# Patient Record
Sex: Female | Born: 1995 | Race: White | Hispanic: No | Marital: Single | Smoking: Never smoker
Health system: Southern US, Community
[De-identification: ages and names within clinical notes are randomized; demographics above are authoritative.]

## PROBLEM LIST (undated history)

## (undated) DIAGNOSIS — J45909 Unspecified asthma, uncomplicated: Secondary | ICD-10-CM

## (undated) DIAGNOSIS — G43909 Migraine, unspecified, not intractable, without status migrainosus: Secondary | ICD-10-CM

## (undated) DIAGNOSIS — Z789 Other specified health status: Secondary | ICD-10-CM

## (undated) HISTORY — PX: GANGLION CYST EXCISION: SHX1691

## (undated) HISTORY — DX: Other specified health status: Z78.9

## (undated) HISTORY — PX: WISDOM TOOTH EXTRACTION: SHX21

---
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
## 2017-01-07 ENCOUNTER — Ambulatory Visit (HOSPITAL_COMMUNITY)
Admission: EM | Admit: 2017-01-07 | Discharge: 2017-01-07 | Disposition: A | Discharge status: Home / Self Care | Payer: 59 | Attending: Emergency Medicine | Admitting: Emergency Medicine

## 2017-01-07 ENCOUNTER — Encounter (HOSPITAL_COMMUNITY): Payer: Self-pay | Admitting: Emergency Medicine

## 2017-01-07 DIAGNOSIS — K2900 Acute gastritis without bleeding: Secondary | ICD-10-CM | POA: Diagnosis not present

## 2017-01-07 DIAGNOSIS — J302 Other seasonal allergic rhinitis: Secondary | ICD-10-CM

## 2017-01-07 LAB — POCT URINALYSIS DIP (DEVICE)
GLUCOSE, UA: NEGATIVE mg/dL
KETONES UR: NEGATIVE mg/dL
Nitrite: NEGATIVE
Protein, ur: 30 mg/dL — AB
SPECIFIC GRAVITY, URINE: 1.025 (ref 1.005–1.030)
Urobilinogen, UA: 1 mg/dL (ref 0.0–1.0)
pH: 7 (ref 5.0–8.0)

## 2017-01-07 LAB — POCT H PYLORI SCREEN: H. PYLORI SCREEN, POC: NEGATIVE

## 2017-01-07 LAB — POCT PREGNANCY, URINE: Preg Test, Ur: NEGATIVE

## 2017-01-07 MED ORDER — RANITIDINE HCL 150 MG PO TABS: 150.0000 mg | ORAL_TABLET | Freq: Two times a day (BID) | ORAL | 0 refills | Status: DC

## 2017-01-07 MED ORDER — FLUTICASONE PROPIONATE 50 MCG/ACT NA SUSP: 2.0000 | Freq: Every day | NASAL | 2 refills | Status: DC

## 2017-01-07 MED ORDER — OMEPRAZOLE 20 MG PO CPDR: 20.0000 mg | DELAYED_RELEASE_CAPSULE | Freq: Every day | ORAL | 0 refills | Status: DC

## 2017-01-07 NOTE — ED Provider Notes (Signed)
[facility]    CSN: [id number] Arrival date & time: 01/07/17  1325     History   Chief Complaint Chief Complaint  Patient presents with  . URI    HPI Joan Estrada is a 21 y.o. female.   HPI  She is a 21 year old woman here with her aunt for evaluation of abdominal pain. She reports a two-week history of intermittent abdominal pain. It is getting worse, especially over the last 2 days. She reports sharp pains that occur in the epigastric area. They last for about 30 seconds and are very intense. Sometimes they occur every 5 minutes, sometimes less frequently. The are triggered by eating. She has had an episode of vomiting yesterday as well as some loose stool. She denies any blood in the vomit or stool. Denies frequent use of NSAIDs, caffeine, alcohol, chocolate. She is a nonsmoker. No urinary symptoms. No vaginal discharge.  She also reports a long history of nasal congestion and postnasal drainage. She states this causes her to cough. She is not currently taking any medication for this. No fevers. She reports some wheezing yesterday, but this responded well to her albuterol inhaler.  Past Medical History:  Diagnosis Date  . Asthma   . Migraine     There are no active problems to display for this patient.   Past Surgical History:  Procedure Laterality Date  . GANGLION CYST EXCISION    . WISDOM TOOTH EXTRACTION      OB History    No data available       Home Medications    Prior to Admission medications   Medication Sig Start Date End Date Taking? Authorizing Provider  albuterol (PROVENTIL HFA;VENTOLIN HFA) 108 (90 BASE) MCG/ACT inhaler Inhale 4 puffs into the lungs every 4 (four) hours as needed for wheezing or shortness of breath. 04/14/15  Yes Marcellina Millin, MD  fluticasone (FLONASE) 50 MCG/ACT nasal spray Place 2 sprays into both nostrils daily. 01/07/17   Charm Rings, MD  omeprazole (PRILOSEC) 20 MG capsule Take 1 capsule (20 mg total) by mouth  daily. 01/07/17   Charm Rings, MD  ranitidine (ZANTAC) 150 MG tablet Take 1 tablet (150 mg total) by mouth 2 (two) times daily. 01/07/17   Charm Rings, MD    Family History No family history on file.  Social History Social History  Substance Use Topics  . Smoking status: Never Smoker  . Smokeless tobacco: Never Used  . Alcohol use Yes     Allergies   Patient has no known allergies.   Review of Systems Review of Systems As in history of present illness  Physical Exam Triage Vital Signs ED Triage Vitals [01/07/17 1435]  Enc Vitals Group     BP 109/74     Pulse Rate 103     Resp 20     Temp 98.2 F (36.8 C)     Temp Source Oral     SpO2 99 %     Weight      Height      Head Circumference      Peak Flow      Pain Score      Pain Loc      Pain Edu?      Excl. in GC?    No data found.   Updated Vital Signs BP 109/74 (BP Location: Left Arm)   Pulse 103   Temp 98.2 F (36.8 C) (Oral)   Resp 20  LMP 12/09/2016   SpO2 99%   Visual Acuity Right Eye Distance:   Left Eye Distance:   Bilateral Distance:    Right Eye Near:   Left Eye Near:    Bilateral Near:     Physical Exam  Constitutional: She is oriented to person, place, and time. She appears well-developed and well-nourished. No distress.  HENT:  Mouth/Throat: No oropharyngeal exudate.  TMs normal bilaterally. Nasal mucosa slightly inflamed. Minor erythema to oropharynx.  Neck: Neck supple.  Cardiovascular: Normal rate, regular rhythm and normal heart sounds.   No murmur heard. Pulmonary/Chest: Effort normal and breath sounds normal. No respiratory distress. She has no wheezes. She has no rales.  Abdominal: Soft. Bowel sounds are normal. She exhibits no distension. There is tenderness (in suprapubic and epigastric). There is no rebound and no guarding.  No CVA tenderness. Negative Murphy's.  Lymphadenopathy:    She has no cervical adenopathy.  Neurological: She is alert and oriented to person,  place, and time.     UC Treatments / Results  Labs (all labs ordered are listed, but only abnormal results are displayed) Labs Reviewed  POCT URINALYSIS DIP (DEVICE) - Abnormal; Notable for the following:       Result Value   Bilirubin Urine SMALL (*)    Hgb urine dipstick TRACE (*)    Protein, ur 30 (*)    Leukocytes, UA TRACE (*)    All other components within normal limits  POCT PREGNANCY, URINE  POCT H PYLORI SCREEN    EKG  EKG Interpretation None       Radiology No results found.  Procedures Procedures (including critical care time)  Medications Ordered in UC Medications - No data to display   Initial Impression / Assessment and Plan / UC Course  I have reviewed the triage vital signs and the nursing notes.  Pertinent labs & imaging results that were available during my care of the patient were reviewed by me and considered in my medical decision making (see chart for details).     Upper respiratory symptoms likely due to chronic allergies. Recommended loratadine and Flonase.  Abdominal pain most consistent with gastritis. Treat with omeprazole and ranitidine for 2 weeks.  UA is abnormal, but patient is about to start her period and has had some spotting. No symptoms of UTI.  Follow-up with PCP if not improving over the next 2 weeks.  Final Clinical Impressions(s) / UC Diagnoses   Final diagnoses:  Chronic seasonal allergic rhinitis, unspecified trigger  Acute gastritis without hemorrhage, unspecified gastritis type    New Prescriptions New Prescriptions   FLUTICASONE (FLONASE) 50 MCG/ACT NASAL SPRAY    Place 2 sprays into both nostrils daily.   OMEPRAZOLE (PRILOSEC) 20 MG CAPSULE    Take 1 capsule (20 mg total) by mouth daily.   RANITIDINE (ZANTAC) 150 MG TABLET    Take 1 tablet (150 mg total) by mouth 2 (two) times daily.     Charm RingsErin J Vivica Dobosz, MD 01/07/17 1539

## 2017-01-07 NOTE — ED Triage Notes (Signed)
Pt c/o cold sx onset 2 weeks and intermittent epigastric pain onset: 1 week ++   Sx include: nasal congestion, prod cough  Sx include: n/v/d... Had 2 loose stools yest and x1 vomiting yest  Denies: fevers, urinary sx  Taking: OTC cold meds  A&O x4... NAD

## 2017-01-07 NOTE — Discharge Instructions (Signed)
The congestion and drainage is likely coming from allergies. Take Claritin or loratadine daily. Use Flonase daily at bedtime. You will likely need to use these on a regular basis. Stop using the nasal spray you have already.  Your abdominal pain is likely coming from gastritis. This is irritation to the lining of your stomach. Take omeprazole daily for 2 weeks. Take ranitidine twice a day for 2 weeks. You should see improvement within a few days. If this is not improving, or comes back, please follow-up with your primary care doctor for additional evaluation.

## 2017-04-13 ENCOUNTER — Encounter (HOSPITAL_COMMUNITY): Payer: Self-pay | Admitting: Emergency Medicine

## 2017-04-13 ENCOUNTER — Ambulatory Visit (HOSPITAL_COMMUNITY)
Admission: EM | Admit: 2017-04-13 | Discharge: 2017-04-13 | Disposition: A | Discharge status: Home / Self Care | Payer: 59 | Attending: Internal Medicine | Admitting: Internal Medicine

## 2017-04-13 DIAGNOSIS — R0982 Postnasal drip: Secondary | ICD-10-CM | POA: Diagnosis not present

## 2017-04-13 DIAGNOSIS — J4520 Mild intermittent asthma, uncomplicated: Secondary | ICD-10-CM | POA: Diagnosis not present

## 2017-04-13 DIAGNOSIS — J029 Acute pharyngitis, unspecified: Secondary | ICD-10-CM

## 2017-04-13 DIAGNOSIS — J301 Allergic rhinitis due to pollen: Secondary | ICD-10-CM

## 2017-04-13 MED ORDER — PREDNISONE 20 MG PO TABS: ORAL_TABLET | ORAL | 0 refills | Status: DC

## 2017-04-13 NOTE — ED Triage Notes (Signed)
Pt states she was having sinus drainage yesterday with a mild sore throat.  Today she woke up with her throat hurting more and she noticed white on her throat.

## 2017-04-13 NOTE — Discharge Instructions (Signed)
Continue using the Allegra and Flonase nasal spray. Drink plenty of fluids to stay well-hydrated and to keep the drainage off the back of your throat. For stronger antihistaminic effect such as at nighttime he may take Chlor-Trimeton AKA chlorpheniramine 2 or 4 mg every 4 hours as needed or just at nighttime. Take the prednisone as directed and with food. Do not forget to take your inhaler with you on your trip as well as the other medicine she takes for allergies. HAPPY BIRTHDAY TOMORROW, ENJOY. 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

## 2017-04-13 NOTE — ED Provider Notes (Signed)
CSN: [id number]     Arrival date & time 04/13/17  1014 History   First MD Initiated Contact with Patient 04/13/17 1124     Chief Complaint  Patient presents with  . Sore Throat   (Consider location/radiation/quality/duration/timing/severity/associated sxs/prior Treatment) 21 year old female has had some PND for the past few days and follow it by soreness of the throat. She is going camping day after tomorrow and also make sure she does not have strep. She does have a history of seasonal allergies with PND, runny nose and occasional ear discomfort. She denies any recent fevers or chills. She does take a daily 24-hour Allegra as well as Flonase daily. LMP was approximately 3 weeks ago. Patient has a history of asthma but has not had symptoms recently.      Past Medical History:  Diagnosis Date  . Asthma   . Migraine    Past Surgical History:  Procedure Laterality Date  . GANGLION CYST EXCISION    . WISDOM TOOTH EXTRACTION     History reviewed. No pertinent family history. Social History  Substance Use Topics  . Smoking status: Never Smoker  . Smokeless tobacco: Never Used  . Alcohol use Yes   OB History    No data available     Review of Systems  Constitutional: Negative.  Negative for activity change, appetite change, chills, fatigue and fever.  HENT: Positive for congestion, postnasal drip and rhinorrhea. Negative for facial swelling.   Eyes: Negative.   Respiratory: Positive for cough. Negative for shortness of breath and wheezing.   Cardiovascular: Negative.   Gastrointestinal: Negative.   Musculoskeletal: Negative for neck pain and neck stiffness.  Skin: Negative for pallor and rash.  Neurological: Negative.   All other systems reviewed and are negative.   Allergies  Patient has no known allergies.  Home Medications   Prior to Admission medications   Medication Sig Start Date End Date Taking? Authorizing Provider  albuterol (PROVENTIL HFA;VENTOLIN HFA) 108  (90 BASE) MCG/ACT inhaler Inhale 4 puffs into the lungs every 4 (four) hours as needed for wheezing or shortness of breath. 04/14/15  Yes Marcellina Millin, MD  fluticasone (FLONASE) 50 MCG/ACT nasal spray Place 2 sprays into both nostrils daily. 01/07/17  Yes Charm Rings, MD  ranitidine (ZANTAC) 150 MG tablet Take 1 tablet (150 mg total) by mouth 2 (two) times daily. 01/07/17  Yes Charm Rings, MD  omeprazole (PRILOSEC) 20 MG capsule Take 1 capsule (20 mg total) by mouth daily. 01/07/17   Charm Rings, MD  predniSONE (DELTASONE) 20 MG tablet Take 2 tabs po on first day, 2 tabs second day, 2 tabs third day, 1 tab fourth day, 1 tab 5th day. Take with food. 04/13/17   Hayden Rasmussen, NP   Meds Ordered and Administered this Visit  Medications - No data to display  BP 105/75 (BP Location: Left Arm)   Pulse 73   Temp 98.3 F (36.8 C) (Oral)   LMP 03/18/2017 (Approximate)   SpO2 100%  No data found.   Physical Exam  Constitutional: She is oriented to person, place, and time. She appears well-developed and well-nourished. No distress.  HENT:  Head: Normocephalic and atraumatic.  Right Ear: External ear normal.  Left Ear: External ear normal.  Mouth/Throat: No oropharyngeal exudate.  Oropharynx with minimal injection, no erythema, positive for clear PND. No exudates. Airway widely patent. Voice is normal.  Lateral TMs normal.  Eyes: EOM are normal.  Neck: Normal range of motion. Neck  supple.  Cardiovascular: Normal rate, regular rhythm, normal heart sounds and intact distal pulses.   Pulmonary/Chest: Effort normal and breath sounds normal. No respiratory distress. She has no wheezes. She has no rales. She exhibits no tenderness.  Musculoskeletal: Normal range of motion. She exhibits no edema.  Lymphadenopathy:    She has no cervical adenopathy.  Neurological: She is alert and oriented to person, place, and time.  Skin: Skin is warm. She is diaphoretic.  Psychiatric: She has a normal mood and affect.   Nursing note and vitals reviewed.   Urgent Care Course     Procedures (including critical care time)  Labs Review Labs Reviewed - No data to display  Imaging Review No results found.   Visual Acuity Review  Right Eye Distance:   Left Eye Distance:   Bilateral Distance:    Right Eye Near:   Left Eye Near:    Bilateral Near:         MDM   1. Sore throat   2. PND (post-nasal drip)   3. Seasonal allergic rhinitis due to pollen   4. Mild intermittent asthma without complication    Continue using the Allegra and Flonase nasal spray. Drink plenty of fluids to stay well-hydrated and to keep the drainage off the back of your throat. For stronger antihistaminic effect such as at nighttime he may take Chlor-Trimeton AKA chlorpheniramine 2 or 4 mg every 4 hours as needed or just at nighttime. Take the prednisone as directed and with food. Do not forget to take your inhaler with you on your trip as well as the other medicine she takes for allergies. HAPPY BIRTHDAY TOMORROW, ENJOY. 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 Meds ordered this encounter  Medications  . predniSONE (DELTASONE) 20 MG tablet    Sig: Take 2 tabs po on first day, 2 tabs second day, 2 tabs third day, 1 tab fourth day, 1 tab 5th day. Take with food.    Dispense:  8 tablet    Refill:  0    Order Specific Question:   Supervising Provider    Answer:   Eustace Moore [[id number]]       Hayden Rasmussen, NP 04/13/17 1153

## 2019-07-02 DIAGNOSIS — Z1159 Encounter for screening for other viral diseases: Secondary | ICD-10-CM | POA: Insufficient documentation

## 2021-04-10 ENCOUNTER — Other Ambulatory Visit (HOSPITAL_COMMUNITY)
Admission: RE | Admit: 2021-04-10 | Discharge: 2021-04-10 | Disposition: A | Discharge status: Home / Self Care | Payer: 59 | Source: Ambulatory Visit | Attending: Physician Assistant | Admitting: Physician Assistant

## 2021-04-10 ENCOUNTER — Other Ambulatory Visit: Payer: Self-pay | Admitting: Physician Assistant

## 2021-04-10 DIAGNOSIS — Z124 Encounter for screening for malignant neoplasm of cervix: Secondary | ICD-10-CM | POA: Diagnosis not present

## 2021-04-16 LAB — CYTOLOGY - PAP
Chlamydia: NEGATIVE
Comment: NEGATIVE
Comment: NEGATIVE
Comment: NORMAL
Diagnosis: UNDETERMINED — AB
High risk HPV: NEGATIVE
Neisseria Gonorrhea: NEGATIVE

## 2022-04-21 DIAGNOSIS — F900 Attention-deficit hyperactivity disorder, predominantly inattentive type: Secondary | ICD-10-CM | POA: Insufficient documentation

## 2023-02-24 NOTE — Progress Notes (Signed)
 Subjective:  Patient ID: Joan Estrada is a 27 y.o. female.  Chief Complaint  Patient presents with  . Mouth or throat complaint     HPI  Mouth or throat complaint Primary Symptom: Sore Throat/Mouth Problem   Duration of current symptoms:  2 days Associated symptoms: no abdominal pain, no myalgias, no chills, no nasal congestion, no cough, no appetite change, no drooling, no ear pain, no facial swelling, no fatigue, no fever, no headaches, no mouth sores, no neck stiffness, no rash, no rhinorrhea, no diaphoresis and no swollen glands   Red flag features: no muffled or 'hot potato' voice, no hoarseness, no drooling, no stridor, no respiratory distress and no 'sniffing' or 'tripod' positions   Treatments tried: gargling   Response to Treatment: No change   Special considerations: none apply          Review of Systems  Constitutional: Negative for appetite change, chills, diaphoresis, fatigue and fever.  HENT: Negative for congestion, drooling, ear pain, facial swelling, mouth sores and rhinorrhea.   Respiratory: Negative for cough.   Gastrointestinal: Negative for abdominal pain.  Musculoskeletal: Negative for myalgias and neck stiffness.  Skin: Negative for rash.  Neurological: Negative for headaches.    Social History   Tobacco Use  Smoking Status Never  Smokeless Tobacco Never   History reviewed. No pertinent past medical history. History reviewed. No pertinent surgical history. History reviewed. No pertinent family history. Objective:      Physical Exam Vitals reviewed.  Constitutional:      General: She is not in acute distress.    Appearance: Normal appearance. She is not ill-appearing.  HENT:     Head: Normocephalic and atraumatic.     Right Ear: Hearing, tympanic membrane, ear canal and external ear normal.     Left Ear: Hearing, tympanic membrane, ear canal and external ear normal.     Nose: Nose normal. No mucosal edema, congestion or rhinorrhea.      Right Sinus: No maxillary sinus tenderness or frontal sinus tenderness.     Left Sinus: No maxillary sinus tenderness or frontal sinus tenderness.     Mouth/Throat:     Mouth: Mucous membranes are moist.     Pharynx: Oropharynx is clear. Posterior oropharyngeal erythema present. No pharyngeal swelling or oropharyngeal exudate.     Tonsils: 1+ on the right. 1+ on the left.  Eyes:     Extraocular Movements: Extraocular movements intact.     Conjunctiva/sclera: Conjunctivae normal.     Pupils: Pupils are equal, round, and reactive to light.  Cardiovascular:     Rate and Rhythm: Normal rate.     Pulses: Normal pulses.     Heart sounds: Normal heart sounds. No murmur heard.    No friction rub. No gallop.  Pulmonary:     Effort: Pulmonary effort is normal. No respiratory distress.     Breath sounds: Normal breath sounds. No stridor. No wheezing, rhonchi or rales.  Chest:     Chest wall: No tenderness.  Abdominal:     Palpations: Abdomen is soft. Abdomen is not rigid.     Tenderness: There is no abdominal tenderness.  Musculoskeletal:        General: Normal range of motion.  Lymphadenopathy:     Cervical: No cervical adenopathy.  Skin:    General: Skin is warm and dry.  Neurological:     General: No focal deficit present.     Mental Status: She is alert and oriented to person, place, and  time.  Psychiatric:        Mood and Affect: Mood normal.        Behavior: Behavior normal.       Assessment/Plan:  HPI provided by Self  Based on today's visit:history, physical exam and all relevant testing completed in clinic today patient's visit diagnosis is/includes  1. Acute pharyngitis, unspecified etiology   2. Sore throat    Patient does not have a history of chronic conditions.  Treatment plan includes:  Orders Placed: Orders Placed This Encounter  Procedures  . POCT Strep Molecular   Medications ordered this visit   Signed Prescriptions Disp Refills  . acetaminophen  325  mg cap 30 capsule 0    Sig: Take 1-2 capsules by mouth 4 (four) times a day for 7 days Max 12 capsules in 24 hours.    Current medication list and any new medications prescribed or recommended today were reviewed with the patient and specific instructions were provided Yes  Provider Recommendations   Gargle with salt water.  Increase fluid intake.   Tylenol  or Ibuprofen as per label instructions for pain and/or fever.  Call 911 or go to the nearest emergency department if experiencing: difficulty breathing, inability to swallow fluids, drooling saliva from the mouth, or decreased urination.   Follow up with primary care provider immediately if symptoms worsen or mouth pain interferes with the ability to eat or drink.   Follow up with primary care provider if:   - Symptoms not improved in 3-4 days   Follow up care instructions were provided and reviewed?with the  Patient. All questions were answered. Patient verbalized understanding of plan of care today.

## 2023-08-02 ENCOUNTER — Ambulatory Visit: Payer: No Typology Code available for payment source | Admitting: Family Medicine

## 2023-08-17 ENCOUNTER — Ambulatory Visit: Payer: No Typology Code available for payment source | Admitting: Family Medicine

## 2023-08-17 ENCOUNTER — Encounter: Payer: Self-pay | Admitting: Family Medicine

## 2023-08-17 VITALS — BP 108/68 | HR 62 | Temp 98.1°F | Ht 66.0 in | Wt 154.0 lb

## 2023-08-17 DIAGNOSIS — Z0000 Encounter for general adult medical examination without abnormal findings: Secondary | ICD-10-CM | POA: Diagnosis not present

## 2023-08-17 DIAGNOSIS — L989 Disorder of the skin and subcutaneous tissue, unspecified: Secondary | ICD-10-CM

## 2023-08-17 NOTE — Progress Notes (Signed)
Chief Complaint  Patient presents with   New Patient (Initial Visit)     Well Woman Joan Estrada is here for a complete physical.   Her last physical was >1 year ago.  Current diet: in general, a "healthy" diet. Current exercise: yoga, walking. Fatigue out of ordinary? No Seatbelt? Yes Advanced directive? No  Health Maintenance Pap/HPV- Yes Tetanus- Yes HIV screening- Yes Hep C screening- Yes  Past Medical History:  Diagnosis Date   No known health problems      Past Surgical History:  Procedure Laterality Date   GANGLION CYST EXCISION Left 2010   L wrist   WISDOM TOOTH EXTRACTION  2016    Medications  Current Outpatient Medications on File Prior to Visit  Medication Sig Dispense Refill   PARAGARD INTRAUTERINE COPPER IU by Intrauterine route.      Allergies No Known Allergies  Review of Systems: Constitutional:  no unexpected weight changes Eye:  no recent significant change in vision Ear/Nose/Mouth/Throat:  Ears:  no tinnitus or vertigo and no recent change in hearing Nose/Mouth/Throat:  no complaints of nasal congestion, no sore throat Cardiovascular: no chest pain Respiratory:  no cough and no shortness of breath Gastrointestinal:  no abdominal pain, no change in bowel habits GU:  Female: negative for dysuria or pelvic pain Musculoskeletal/Extremities:  no pain of the joints Integumentary (Skin/Breast):  Lesion on R cheek Neurologic:  no headaches Endocrine:  denies fatigue Hematologic/Lymphatic:  No areas of easy bleeding  Exam BP 108/68 (BP Location: Left Arm, Patient Position: Sitting, Cuff Size: Normal)   Pulse 62   Temp 98.1 F (36.7 C) (Oral)   Ht 5\' 6"  (1.676 m)   Wt 154 lb (69.9 kg)   SpO2 98%   BMI 24.86 kg/m  General:  well developed, well nourished, in no apparent distress Skin:  See below; otherwise, no significant moles, warts, or growths Head:  no masses, lesions, or tenderness Eyes:  pupils equal and round, sclera anicteric  without injection Ears:  canals without lesions, TMs shiny without retraction, no obvious effusion, no erythema Nose:  nares patent, mucosa normal, and no drainage  Throat/Pharynx:  lips and gingiva without lesion; tongue and uvula midline; non-inflamed pharynx; no exudates or postnasal drainage Neck: neck supple without adenopathy, thyromegaly, or masses Lungs:  clear to auscultation, breath sounds equal bilaterally, no respiratory distress Cardio:  regular rate and rhythm, no bruits, no LE edema Abdomen:  abdomen soft, nontender; bowel sounds normal; no masses or organomegaly Genital: Defer to GYN Musculoskeletal:  symmetrical muscle groups noted without atrophy or deformity Extremities:  no clubbing, cyanosis, or edema, no deformities, no skin discoloration Neuro:  gait normal; deep tendon reflexes normal and symmetric Psych: well oriented with normal range of affect and appropriate judgment/insight   R side of face, scaly and slightly raised lesion  Assessment and Plan  Well adult exam - Plan: CBC, Comprehensive metabolic panel, Lipid panel  Skin lesion - Plan: Ambulatory referral to Dermatology   Well 27 y.o. female. Counseled on diet and exercise. Advanced directive form provided today.  Pt reports changing contour of the lesion on her R cheek and requesting to see derm. Referral placed.  Other orders as above. Follow up in 1 yr. The patient voiced understanding and agreement to the plan.  Jilda Roche [place], DO 08/17/23 4:53 PM

## 2023-08-17 NOTE — Patient Instructions (Addendum)
Call Center for [facility] at [facility] at [phone] for an appointment.  They are located at [address], [place], [place], [address] (right across the hall from our office).  Keep the diet clean and stay active.  Please consider adding some weight resistance exercise to your routine. Consider yoga as well.   If you do not hear anything about your referral in the next 1-2 weeks, call our office and ask for an update.  I recommend getting the flu shot in mid October. This suggestion would change if the CDC comes out with a different recommendation.   Give [place] 2-3 business days to get the results of your labs back.   Let us know if you need anything.

## 2023-08-18 LAB — CBC
HCT: 40.6 % (ref 36.0–46.0)
Hemoglobin: 13.6 g/dL (ref 12.0–15.0)
MCHC: 33.5 g/dL (ref 30.0–36.0)
MCV: 94.2 fl (ref 78.0–100.0)
Platelets: 294 10*3/uL (ref 150.0–400.0)
RBC: 4.31 Mil/uL (ref 3.87–5.11)
RDW: 13 % (ref 11.5–15.5)
WBC: 6.3 10*3/uL (ref 4.0–10.5)

## 2023-08-18 LAB — LIPID PANEL
Cholesterol: 204 mg/dL — ABNORMAL HIGH (ref 0–200)
HDL: 84.1 mg/dL (ref >39.00)
LDL Cholesterol: 88 mg/dL (ref 0–99)
NonHDL: 119.52
Total CHOL/HDL Ratio: 2
Triglycerides: 158 mg/dL — ABNORMAL HIGH (ref 0.0–149.0)
VLDL: 31.6 mg/dL (ref 0.0–40.0)

## 2023-08-18 LAB — COMPREHENSIVE METABOLIC PANEL
ALT: 11 U/L (ref 0–35)
AST: 16 U/L (ref 0–37)
Albumin: 4.5 g/dL (ref 3.5–5.2)
Alkaline Phosphatase: 44 U/L (ref 39–117)
BUN: 15 mg/dL (ref 6–23)
CO2: 27 meq/L (ref 19–32)
Calcium: 9.5 mg/dL (ref 8.4–10.5)
Chloride: 101 meq/L (ref 96–112)
Creatinine, Ser: 0.9 mg/dL (ref 0.40–1.20)
GFR: 88.33 mL/min (ref >60.00)
Glucose, Bld: 88 mg/dL (ref 70–99)
Potassium: 4.6 meq/L (ref 3.5–5.1)
Sodium: 138 meq/L (ref 135–145)
Total Bilirubin: 0.4 mg/dL (ref 0.2–1.2)
Total Protein: 7.1 g/dL (ref 6.0–8.3)

## 2023-10-06 ENCOUNTER — Encounter: Payer: Self-pay | Admitting: Family Medicine

## 2023-11-09 ENCOUNTER — Encounter: Payer: Self-pay | Admitting: Family Medicine

## 2023-11-09 ENCOUNTER — Telehealth (INDEPENDENT_AMBULATORY_CARE_PROVIDER_SITE_OTHER): Payer: Managed Care, Other (non HMO) | Admitting: Family Medicine

## 2023-11-09 DIAGNOSIS — F338 Other recurrent depressive disorders: Secondary | ICD-10-CM | POA: Insufficient documentation

## 2023-11-09 DIAGNOSIS — F411 Generalized anxiety disorder: Secondary | ICD-10-CM | POA: Diagnosis not present

## 2023-11-09 MED ORDER — BUPROPION HCL ER (XL) 150 MG PO TB24: 150.0000 mg | ORAL_TABLET | Freq: Every day | ORAL | 1 refills | Status: DC

## 2023-11-09 NOTE — Progress Notes (Signed)
Chief Complaint  Patient presents with   Medication Management    Discuss medication    Subjective Joan Estrada presents for f/u anxiety/depression. We are interacting via web portal for an electronic face-to-face visit. I verified patient's ID using 2 identifiers. Patient agreed to proceed with visit via this method. Patient is at home, I am at office. Patient and I are present for visit.   Pt is currently being treated with nothing.  Reports difficulty concentrating, depressed mood, racing thoughts.  She has been on Wellbutrin in the past and did well. No thoughts of harming self or others. No self-medication with alcohol, prescription drugs or illicit drugs. Pt is not following with a counselor/psychologist.  Past Medical History:  Diagnosis Date   No known health problems    Allergies as of 11/09/2023   No Known Allergies      Medication List        Accurate as of November 09, 2023  2:05 PM. If you have any questions, ask your nurse or doctor.          PARAGARD INTRAUTERINE COPPER IU by Intrauterine route.        Exam No conversational dyspnea Age appropriate judgment and insight Nml affect and mood  Assessment and Plan  GAD (generalized anxiety disorder)  Seasonal affective disorder (HCC)  1/2.  Chronic, uncontrolled.  Start Wellbutrin XL 150 mg daily.  She has been on this before and has done historically well.  In light of this, I will see her in 5 months.  If she is not doing well in the next month a month and a half, she will follow-up and we will proceed accordingly. The patient voiced understanding and agreement to the plan.  [name], DO 11/09/23 2:05 PM

## 2024-02-16 ENCOUNTER — Encounter: Payer: Self-pay | Admitting: Family Medicine

## 2024-03-17 ENCOUNTER — Encounter: Payer: Self-pay | Admitting: Family Medicine

## 2024-03-17 DIAGNOSIS — F338 Other recurrent depressive disorders: Secondary | ICD-10-CM

## 2024-03-17 DIAGNOSIS — F411 Generalized anxiety disorder: Secondary | ICD-10-CM

## 2024-03-17 MED ORDER — BUPROPION HCL ER (XL) 150 MG PO TB24: 150.0000 mg | ORAL_TABLET | Freq: Every day | ORAL | 1 refills | Status: DC

## 2024-05-25 ENCOUNTER — Ambulatory Visit (INDEPENDENT_AMBULATORY_CARE_PROVIDER_SITE_OTHER)

## 2024-05-25 ENCOUNTER — Ambulatory Visit (INDEPENDENT_AMBULATORY_CARE_PROVIDER_SITE_OTHER): Admitting: Sports Medicine

## 2024-05-25 ENCOUNTER — Ambulatory Visit (INDEPENDENT_AMBULATORY_CARE_PROVIDER_SITE_OTHER): Admitting: Family

## 2024-05-25 ENCOUNTER — Other Ambulatory Visit: Payer: Self-pay

## 2024-05-25 VITALS — BP 120/80 | Ht 66.0 in | Wt 136.0 lb

## 2024-05-25 VITALS — BP 122/74 | HR 70 | Ht 66.0 in | Wt 136.2 lb

## 2024-05-25 DIAGNOSIS — M25531 Pain in right wrist: Secondary | ICD-10-CM

## 2024-05-25 DIAGNOSIS — M79641 Pain in right hand: Secondary | ICD-10-CM

## 2024-05-25 DIAGNOSIS — M778 Other enthesopathies, not elsewhere classified: Secondary | ICD-10-CM

## 2024-05-25 DIAGNOSIS — M79631 Pain in right forearm: Secondary | ICD-10-CM | POA: Diagnosis not present

## 2024-05-25 MED ORDER — MELOXICAM 15 MG PO TABS: 15.0000 mg | ORAL_TABLET | Freq: Every day | ORAL | 0 refills | Status: DC

## 2024-05-25 NOTE — Patient Instructions (Signed)
-   Start meloxicam 15 mg daily x3 weeks. May use remaining NSAID as needed once daily for pain control.  Do not to use additional over-the-counter NSAIDs (ibuprofen, naproxen, Advil, Aleve, etc.) while taking prescription NSAIDs.  May use Tylenol  [phone] mg 2 to 3 times a day for breakthrough pain. Recommend using a wrist brace Wrist HEP  4-6 week follow up

## 2024-05-25 NOTE — Progress Notes (Signed)
 [facility] [address] [place] [address] Phone: [phone]   Assessment and Plan:     1. Right wrist pain 2. Right forearm pain 3. Extensor carpi ulnaris tendinitis -Acute, initial visit - Most consistent with musculotendinous strain of right ECU likely occurring during kickboxing exercise - X-ray obtained in clinic.  My interpretation: No acute fracture or dislocation - Recommend using wrist brace without thumb spica for rest and protection over the next 3 to 4 weeks - Recommend starting gentle wrist HEP to prevent stiffness - Start meloxicam 15 mg daily x3weeks.  May use remaining NSAID as needed once daily for pain control.  Do not to use additional over-the-counter NSAIDs (ibuprofen, naproxen, Advil, Aleve, etc.) while taking prescription NSAIDs.  May use Tylenol  [phone] mg 2 to 3 times a day for breakthrough pain.    Sports Medicine: Musculoskeletal Ultrasound. Exam: Limited US  of right wrist and forearm Diagnosis: Right wrist/forearm pain  US  Findings: No cortical abnormalities.  ECU tendon intact.  Hypoechoic stranding through musculotendinous junction of ECU that coincided with area of TTP   Images permanently stored.  US  Impression:  ECU musculotendinous strain  15 additional minutes spent for educating Therapeutic Home Exercise Program.  This included exercises focusing on stretching, strengthening, with focus on eccentric aspects.   Long term goals include an improvement in range of motion, strength, endurance as well as avoiding reinjury. Patient's frequency would include in 1-2 times a day, 3-5 times a week for a duration of 6-12 weeks. Proper technique shown and discussed handout in great detail with ATC.  All questions were discussed and answered.    Pertinent previous records reviewed include urgent care note 05/25/2024  Follow Up: 4 weeks for reevaluation.  Could consider physical therapy versus prednisone   course   Subjective:   I, Joan Estrada, am serving as a Neurosurgeon for Doctor Ulysees Gander  Chief Complaint: right wrist pain   HPI:   05/25/24 Patient is a 28 year old female with right wrist pain. Patient states was punching in kickboxing last night. Able to make a fist. Decreased ROM . Pain Is in forearm. No meds for the pain. Mild swelling. Is leaving for a trip Sunday. No numbness a little tingling. Pain radiates up the forearm. She I warm to the touch patient states   Relevant Historical Information: None pertinent  Additional pertinent review of systems negative.   Current Outpatient Medications:    buPROPion  (WELLBUTRIN  XL) 150 MG 24 hr tablet, Take 1 tablet (150 mg total) by mouth daily., Disp: 90 tablet, Rfl: 1   meloxicam (MOBIC) 15 MG tablet, Take 1 tablet (15 mg total) by mouth daily., Disp: 30 tablet, Rfl: 0   PARAGARD INTRAUTERINE COPPER IU, by Intrauterine route., Disp: , Rfl:    Objective:     Vitals:   05/25/24 1436  BP: 120/80  Weight: 136 lb (61.7 kg)  Height: 5\' 6"  (1.676 m)      Body mass index is 21.95 kg/m.    Physical Exam:    General: Appears well, nad, nontoxic and pleasant Neuro:sensation intact, strength is 5/5 in upper extremities, muscle tone wnl Skin:no susupicious lesions or rashes  Right hand/Wrist:   No deformity or swelling appreciated. TTP lateral distal forearm ROM  Ext 90, flexion 70, radial/ulnar deviation 10/30 nttp over the snuff box, dorsal carpals, volar carpals, radial styloid, ulnar styloid, 1st mcp, tfcc Negative Tinel's, Phalen's, Prayer Tests Negative finklestein Neg tfcc bounce test  No pain with resisted ext, flex Pain with resisted and passive ulnar deviation   Electronically signed by:  Marshall Skeeter [facility] 3:25 PM 05/25/24

## 2024-05-25 NOTE — Progress Notes (Signed)
  Joan Estrada is a 28 y.o. female with the following history as recorded in Epic[employer]:  Patient Active Problem List   Diagnosis Date Noted   GAD (generalized anxiety disorder) 11/09/2023   Seasonal affective disorder (HCC) 11/09/2023    Current Outpatient Medications  Medication Sig Dispense Refill   buPROPion  (WELLBUTRIN  XL) 150 MG 24 hr tablet Take 1 tablet (150 mg total) by mouth daily. 90 tablet 1   PARAGARD INTRAUTERINE COPPER IU by Intrauterine route.     No current facility-administered medications for this visit.    Allergies: Patient has no known allergies.  Past Medical History:  Diagnosis Date   No known health problems     Past Surgical History:  Procedure Laterality Date   GANGLION CYST EXCISION Left 2010   L wrist   WISDOM TOOTH EXTRACTION  2016    Family History  Problem Relation Age of Onset   Heart disease Maternal Grandfather    Lung cancer Paternal Grandmother        didn't smoke   Cervical cancer Neg Hx    Breast cancer Neg Hx    Ovarian cancer Neg Hx    Diabetes Neg Hx     Social History   Tobacco Use   Smoking status: Never   Smokeless tobacco: Never  Substance Use Topics   Alcohol use: Yes    Comment: 3 glasses of wine    Subjective:   Injured her right wrist/ forearm while doing boxing class last night; worried about increased pain with movement; notes that pain shoots up the forearm when she moves the wrist;     Objective:  Vitals:   05/25/24 1046  BP: 122/74  Pulse: 70  SpO2: 99%  Weight: 136 lb 3.2 oz (61.8 kg)  Height: 5\' 6"  (1.676 m)    General: Well developed, well nourished, in no acute distress  Skin : Warm and dry.  Head: Normocephalic and atraumatic  Lungs: Respirations unlabored;  Musculoskeletal: No deformities; mild swelling noted over lateral right wrist; limited range of motion due to pain; Extremities: No edema, cyanosis, clubbing  Vessels: Symmetric bilaterally  Neurologic: Alert and oriented; speech  intact; face symmetrical; moves all extremities well; CNII-XII intact without focal deficit   Assessment:  1. Acute pain of right wrist     Plan:  Concern for possible boxer's fracture vs wrist sprain; patient is leaving for 3 week trip to [place] on Sunday and planning an active vacation; will have her see sports medicine today to ensure proper diagnosis and correct splint applied; in the interim, apply ice and rest the wrist;  Did speak to sports medicine provider's office and asked that we not do imaging here so they can do their own there.   No follow-ups on file.  No orders of the defined types were placed in this encounter.   Requested Prescriptions    No prescriptions requested or ordered in this encounter

## 2024-05-26 ENCOUNTER — Ambulatory Visit: Payer: Self-pay | Admitting: Sports Medicine

## 2024-06-21 ENCOUNTER — Ambulatory Visit (INDEPENDENT_AMBULATORY_CARE_PROVIDER_SITE_OTHER): Payer: Self-pay | Admitting: Podiatry

## 2024-06-21 ENCOUNTER — Encounter: Payer: Self-pay | Admitting: Family Medicine

## 2024-06-21 ENCOUNTER — Telehealth (INDEPENDENT_AMBULATORY_CARE_PROVIDER_SITE_OTHER): Admitting: Family Medicine

## 2024-06-21 DIAGNOSIS — F900 Attention-deficit hyperactivity disorder, predominantly inattentive type: Secondary | ICD-10-CM

## 2024-06-21 DIAGNOSIS — J309 Allergic rhinitis, unspecified: Secondary | ICD-10-CM | POA: Insufficient documentation

## 2024-06-21 DIAGNOSIS — L03032 Cellulitis of left toe: Secondary | ICD-10-CM | POA: Diagnosis not present

## 2024-06-21 DIAGNOSIS — J4520 Mild intermittent asthma, uncomplicated: Secondary | ICD-10-CM | POA: Insufficient documentation

## 2024-06-21 DIAGNOSIS — F321 Major depressive disorder, single episode, moderate: Secondary | ICD-10-CM | POA: Insufficient documentation

## 2024-06-21 DIAGNOSIS — L600 Ingrowing nail: Secondary | ICD-10-CM

## 2024-06-21 DIAGNOSIS — N938 Other specified abnormal uterine and vaginal bleeding: Secondary | ICD-10-CM | POA: Insufficient documentation

## 2024-06-21 DIAGNOSIS — F325 Major depressive disorder, single episode, in full remission: Secondary | ICD-10-CM | POA: Insufficient documentation

## 2024-06-21 DIAGNOSIS — G43109 Migraine with aura, not intractable, without status migrainosus: Secondary | ICD-10-CM | POA: Insufficient documentation

## 2024-06-21 DIAGNOSIS — N939 Abnormal uterine and vaginal bleeding, unspecified: Secondary | ICD-10-CM | POA: Insufficient documentation

## 2024-06-21 MED ORDER — FLUCONAZOLE 150 MG PO TABS: 150.0000 mg | ORAL_TABLET | Freq: Once | ORAL | 0 refills | Status: AC

## 2024-06-21 MED ORDER — AMPHETAMINE-DEXTROAMPHETAMINE 10 MG PO TABS: 10.0000 mg | ORAL_TABLET | Freq: Two times a day (BID) | ORAL | 0 refills | Status: DC

## 2024-06-21 MED ORDER — AMOXICILLIN 500 MG PO CAPS: 500.0000 mg | ORAL_CAPSULE | Freq: Two times a day (BID) | ORAL | 0 refills | Status: AC

## 2024-06-21 NOTE — Progress Notes (Signed)
 Chief Complaint  Patient presents with   Ingrown Toenail    Treatment done in [place]. Left 1st hallux was clipped some, some swelling and drainage. Still painful. Right 1st has a brace on it to lift the nail. Minimal pain, some swelling, no redness. Was given a topical cream called fucidin 2%. She is hoping that the nails do not have to be removed if at all possible. Not diabetic and no anticoag.    HPI: 28 y.o. female presents today with an ingrown toenail to the lateral border of the bilateral hallux toenails.  The left hallux lateral nail border does have redness and some fleshy growth present.  She wants to try to avoid having any type of definitive procedure performed due to cosmetic reasons.  She was treated in [place] for this when she was on a hiking trip.  They clued a steel rod horizontally onto her toe to try to encourage the nail to lift and flatten out to improve the shape of the nail.  Past Medical History:  Diagnosis Date   No known health problems    Past Surgical History:  Procedure Laterality Date   GANGLION CYST EXCISION Left 2010   L wrist   WISDOM TOOTH EXTRACTION  2016   No Known Allergies   Physical Exam: Palpable pedal pulses bilateral.  The hallux toenails have incurvated borders.  There is pain on palpation along the distal lateral borders.  The left hallux lateral nail margin does have a granuloma present with localized erythema.  No active drainage is noted.  There is a small metal rod still attached to the right hallux nail which part has become unglued and is sticking up.  Assessment/Plan of Care: 1. Ingrown toenail   2. Paronychia of toe of left foot     Meds ordered this encounter  Medications   amoxicillin  (AMOXIL ) 500 MG capsule    Sig: Take 1 capsule (500 mg total) by mouth 2 (two) times daily for 7 days.    Dispense:  14 capsule    Refill:  0   fluconazole  (DIFLUCAN ) 150 MG tablet    Sig: Take 1 tablet (150 mg total) by mouth once for 1  dose. This is for yeast infections.    Dispense:  1 tablet    Refill:  0   Strongly recommended a PNA procedure to the left hallux lateral nail border since there is a granuloma present and localized infection.  She declined.  Informed her that it is not standard of care in the [place]  to glue these steel rods onto the toenails.  She was informed there is something similar on [place] that she could purchase and try on her own.  She could also try sticking cotton beneath the offending nail margin to see if this will cause some lifting.  However the standard of care is a PNA procedure.  She is worried about the cosmetic appearance of the toenail so the procedure was discussed in detail.  She will think about it and if she does not have improvement she may call to have this performed.  In the meantime we will treat the infection with amoxicillin  500 mg 1 tablet p.o. twice daily x 7 days.  She sometimes gets a yeast infection from antibiotics so we will also send in a Diflucan  tablet 150 mg to take in case of yeast infection  Follow-up as needed  Eshani Maestre DSABRA [place], DPM, FACFAS [facility]  [address] [place], [place] [address]                Office [phone]  Fax [phone]

## 2024-06-21 NOTE — Progress Notes (Signed)
 Chief Complaint  Patient presents with   Medication Refill    Discuss Medication    Joan Estrada is 28 y.o. female here for ADHD follow up. We are interacting via web portal for an electronic face-to-face visit. I verified patient's ID using 2 identifiers. Patient agreed to proceed with visit via this method. Patient is at home, I am at office. Patient and I are present for visit.   Patient is currently on nothing. Symptoms include inattention, staying on task. She was on Adderall 10 mg bid in the past.  She was dx'd when she was a teenager. Pediatric psych dx'd her.  Started a new position where she has more responsibility.   Past Medical History:  Diagnosis Date   No known health problems    No conversational dyspnea Age appropriate judgment and insight Nml affect and mood  Attention deficit hyperactivity disorder (ADHD), predominantly inattentive type - Plan: amphetamine-dextroamphetamine (ADDERALL) 10 MG tablet  Chronic, not currently controlled.  Restart Adderall 10 mg twice daily.  Requested she try to find her formal diagnosis information and get it sent to us .  Follow-up in 1 month in the office. Pt voiced understanding and agreement to the plan.  Mabel Mt [place], DO 06/21/24 2:43 PM

## 2024-06-26 NOTE — Progress Notes (Unsigned)
    Ben Jackson D.[facility] [address] [place] [address] Phone: [phone]   Assessment and Plan:     There are no diagnoses linked to this encounter.  ***   Pertinent previous records reviewed include ***    Follow Up: ***     Subjective:   I, Joan Estrada, am serving as a Neurosurgeon for Doctor Morene Mace   Chief Complaint: right wrist pain    HPI:    05/25/24 Patient is a 28 year old female with right wrist pain. Patient states was punching in kickboxing last night. Able to make a fist. Decreased ROM . Pain Is in forearm. No meds for the pain. Mild swelling. Is leaving for a trip Sunday. No numbness a little tingling. Pain radiates up the forearm. She I warm to the touch patient states   06/27/2024 Patient states   Relevant Historical Information: None pertinent  Additional pertinent review of systems negative.   Current Outpatient Medications:    amoxicillin  (AMOXIL ) 500 MG capsule, Take 1 capsule (500 mg total) by mouth 2 (two) times daily for 7 days., Disp: 14 capsule, Rfl: 0   amphetamine -dextroamphetamine  (ADDERALL) 10 MG tablet, Take 1 tablet (10 mg total) by mouth 2 (two) times daily., Disp: 60 tablet, Rfl: 0   buPROPion  (WELLBUTRIN  XL) 150 MG 24 hr tablet, Take 1 tablet (150 mg total) by mouth daily., Disp: 90 tablet, Rfl: 1   meloxicam  (MOBIC ) 15 MG tablet, Take 1 tablet (15 mg total) by mouth daily., Disp: 30 tablet, Rfl: 0   PARAGARD INTRAUTERINE COPPER IU, by Intrauterine route., Disp: , Rfl:    UNABLE TO FIND, Apply 1 Application topically daily. Med Name: Fucidin 2% cream, Disp: , Rfl:    Objective:     There were no vitals filed for this visit.    There is no height or weight on file to calculate BMI.    Physical Exam:    ***   Electronically signed by:  Odis Mace D.[facility] 7:36 AM 06/26/24

## 2024-06-27 ENCOUNTER — Ambulatory Visit (INDEPENDENT_AMBULATORY_CARE_PROVIDER_SITE_OTHER): Admitting: Sports Medicine

## 2024-06-27 VITALS — BP 110/70 | HR 96 | Ht 66.0 in

## 2024-06-27 DIAGNOSIS — M778 Other enthesopathies, not elsewhere classified: Secondary | ICD-10-CM | POA: Diagnosis not present

## 2024-06-27 DIAGNOSIS — M79631 Pain in right forearm: Secondary | ICD-10-CM | POA: Diagnosis not present

## 2024-06-27 DIAGNOSIS — M25531 Pain in right wrist: Secondary | ICD-10-CM | POA: Diagnosis not present

## 2024-06-27 NOTE — Patient Instructions (Addendum)
 Discontinue meloxicam .  -Recommend gradual return to physical activity.  Start activity at 50% (speed, duration, reps, sets, intensity) and allow 24 hours to assess for worsening pain.  If 50% is well-tolerated, may increase next activity to 75%.  If 75% is well-tolerated, may increase next physical activity to 100%.  If any of these levels cause pain, recommend dropping down to previous level for an additional 2-3 attempts before advancing.  If no significant improvement in 2 to 4 weeks call and ask for a prednisone  course or PT. Follow up as needed.

## 2024-07-02 ENCOUNTER — Encounter: Payer: Self-pay | Admitting: Podiatry

## 2024-07-17 ENCOUNTER — Ambulatory Visit: Admitting: Podiatry

## 2024-07-18 ENCOUNTER — Encounter: Payer: Self-pay | Admitting: Podiatry

## 2024-07-18 ENCOUNTER — Ambulatory Visit: Admitting: Podiatry

## 2024-07-18 ENCOUNTER — Ambulatory Visit (INDEPENDENT_AMBULATORY_CARE_PROVIDER_SITE_OTHER): Admitting: Podiatry

## 2024-07-18 DIAGNOSIS — L600 Ingrowing nail: Secondary | ICD-10-CM | POA: Diagnosis not present

## 2024-07-18 MED ORDER — CEPHALEXIN 500 MG PO CAPS: 1000.0000 mg | ORAL_CAPSULE | Freq: Two times a day (BID) | ORAL | 0 refills | Status: AC

## 2024-07-18 NOTE — Patient Instructions (Signed)

## 2024-07-18 NOTE — Progress Notes (Signed)
 Patient complains of painful ingrown lateral border(s) toe 1 left. Patient denies fevers, chills, nausea, vomiting.  Objective:  Vitals: Reviewed  General: Well developed, nourished, in no acute distress, alert and oriented x3   Vascular: DP pulse 2/4 bilateral. PT pulse 2/4 bilateral. Moderate edema   Dermatology: Erythema, edema, incurvated nail border lateral hallux left with clear drainage . Tenderness present with palpation. Normal skin tone and texture feet with normal hair growth.  Neurological: Grossly intact. Normal reflexes.   Musculoskeletal: Tenderness with palpation of the distal hallux left. No tenderness or painful ROM at IPJ.  Diagnosis: Ingrown nail lateral border hallux left  Plan: -discussed etiology and treatment of ingrown nails. Discussed surgical vs conservative treatment. -Consent signed for appropriate matrixectomy affected nail(s). -Rx: Keflex  500 mg 2 p.o. twice daily for 7 days  Procedure(s):   - Matrixectomy(s) lateral border hallux left: Toe anesthetized with 3cc 2:1 mixture 2% Lidocaine with epinephrine: Sodium Bicarbonate. Surgical site prepped. Digital tourniquet applied.  Avulsion of nail border. performed. Matrixecomy performed with three 30 second applications of phenol to nail matrix. Site irrigated with alcohol.  Tourniquet released with good vascularity noticed in digit.  Applied triple antibiotic to nailbed and applied gauze and Coban dressing. - Written and oral postoperative instructions given.  -Return for post-op 2 weeks.  JINNY Prentice Binder, DPM

## 2024-07-19 ENCOUNTER — Other Ambulatory Visit: Payer: Self-pay | Admitting: Podiatry

## 2024-07-19 ENCOUNTER — Encounter: Payer: Self-pay | Admitting: Podiatry

## 2024-07-19 ENCOUNTER — Telehealth: Payer: Self-pay | Admitting: Podiatry

## 2024-07-19 DIAGNOSIS — M79673 Pain in unspecified foot: Secondary | ICD-10-CM

## 2024-07-19 DIAGNOSIS — G8918 Other acute postprocedural pain: Secondary | ICD-10-CM

## 2024-07-19 MED ORDER — NEOMYCIN-POLYMYXIN-HC 3.5-10000-1 OT SOLN: OTIC | 1 refills | Status: DC

## 2024-07-19 MED ORDER — TRAMADOL HCL 50 MG PO TABS: 50.0000 mg | ORAL_TABLET | Freq: Three times a day (TID) | ORAL | 0 refills | Status: AC | PRN

## 2024-07-19 NOTE — Telephone Encounter (Signed)
 Patient called back and stated she is in severe pain. Please contact patient at, [phone]

## 2024-07-19 NOTE — Telephone Encounter (Signed)
 Patient rates pain as 10 . What can she do to relieve the pain?

## 2024-08-01 ENCOUNTER — Ambulatory Visit: Admitting: Podiatry

## 2024-08-18 ENCOUNTER — Ambulatory Visit: Admitting: Obstetrics and Gynecology

## 2024-08-18 ENCOUNTER — Encounter: Payer: Self-pay | Admitting: Obstetrics and Gynecology

## 2024-08-18 ENCOUNTER — Other Ambulatory Visit (HOSPITAL_COMMUNITY)
Admission: RE | Admit: 2024-08-18 | Discharge: 2024-08-18 | Disposition: A | Discharge status: Home / Self Care | Source: Ambulatory Visit | Attending: Obstetrics and Gynecology | Admitting: Obstetrics and Gynecology

## 2024-08-18 VITALS — BP 110/81 | HR 64 | Ht 66.0 in | Wt 141.1 lb

## 2024-08-18 DIAGNOSIS — Z124 Encounter for screening for malignant neoplasm of cervix: Secondary | ICD-10-CM

## 2024-08-18 DIAGNOSIS — Z01419 Encounter for gynecological examination (general) (routine) without abnormal findings: Secondary | ICD-10-CM

## 2024-08-18 NOTE — Progress Notes (Signed)
 ANNUAL EXAM Patient name: Joan Estrada MRN [id number]  Date of birth: August 09, 1996 Chief Complaint:   Establish Care (Establish care) and Gynecologic Exam  History of Present Illness:   Joan Estrada is a 28 y.o. G0P0000 being seen today for a routine annual exam.  Current complaints: annual  Menstrual concerns? Yes  32 day cycles, not late more than a few days, heavier with the IUD (not heavier prior to insertion). In HS would skip menses for a few months and was on the pill  Breast or nipple changes? No  Contraception use? Yes paragard since 2018 Sexually active? Yes some pain with intercourse more recently , female partner  Has questions regarding paragard and fertility   Patient's last menstrual period was 08/15/2024 (exact date).   The pregnancy intention screening data noted above was reviewed. Potential methods of contraception were discussed. The patient elected to proceed with No data recorded.  Black/wiry IUD strings, nontender pelvic  Last pap     Component Value Date/Time   DIAGPAP (A) 04/10/2021 0000    - Atypical squamous cells of undetermined significance (ASC-US )   HPVHIGH Negative 04/10/2021 0000   ADEQPAP  04/10/2021 0000    Satisfactory for evaluation; transformation zone component PRESENT.    Last mammogram: n/a.  Last colonoscopy: n/a.      06/21/2024    1:09 PM 11/09/2023    1:56 PM 08/17/2023    3:42 PM  Depression screen PHQ 2/9  Decreased Interest 0 1 0  Down, Depressed, Hopeless 0 0 0  PHQ - 2 Score 0 1 0  Altered sleeping 0 0 0  Tired, decreased energy 0 1 0  Change in appetite 0 0 0  Feeling bad or failure about yourself  0 1 0  Trouble concentrating 0 1 0  Moving slowly or fidgety/restless 0 0 0  Suicidal thoughts 0 0 0  PHQ-9 Score 0 4 0  Difficult doing work/chores Not difficult at all Not difficult at all Not difficult at all        06/21/2024    1:08 PM 11/09/2023    1:58 PM  GAD 7 : Generalized Anxiety Score  Nervous,  Anxious, on Edge 0 1  Control/stop worrying 0 0  Worry too much - different things 0 0  Trouble relaxing 0 0  Restless 0 0  Easily annoyed or irritable 0 0  Afraid - awful might happen 0 0  Total GAD 7 Score 0 1  Anxiety Difficulty Not difficult at all Not difficult at all     Review of Systems:   Pertinent items are noted in HPI Denies any headaches, blurred vision, fatigue, shortness of breath, chest pain, abdominal pain, abnormal vaginal discharge/itching/odor/irritation, problems with periods, bowel movements, urination, or intercourse unless otherwise stated above. Pertinent History Reviewed:  Reviewed past medical,surgical, social and family history.  Reviewed problem list, medications and allergies. Physical Assessment:   Vitals:   08/18/24 0907  BP: 110/81  Pulse: 64  Weight: 141 lb 1.3 oz (64 kg)  Height: 5' 6 (1.676 m)  Body mass index is 22.77 kg/m.        Physical Examination:   General appearance - well appearing, and in no distress  Mental status - alert, oriented to person, place, and time  Psych:  She has a normal mood and affect  Skin - warm and dry, normal color, no suspicious lesions noted  Chest - effort normal, all lung fields clear to auscultation bilaterally  Heart -  normal rate and regular rhythm  Breasts - breasts appear normal, no suspicious masses, no skin or nipple changes or  axillary nodes  Abdomen - soft, nontender, nondistended, no masses or organomegaly  Pelvic -  VULVA: normal appearing vulva with no masses, tenderness or lesions   VAGINA: normal appearing vagina with normal color and discharge, no lesions   CERVIX: normal appearing cervix without discharge or lesions, no CMT  Thin prep pap is done with HR HPV cotesting  UTERUS: uterus is felt to be normal size, shape, consistency and nontender   ADNEXA: No adnexal masses or tenderness noted.  Extremities:  No swelling or varicosities noted  Chaperone present for exam  No results  found for this or any previous visit (from the past 24 hours).    Assessment & Plan:  1. Well woman exam with routine gynecological exam (Primary) - Cervical cancer screening: Discussed screening Q3 years. Reviewed importance of annual exams and limits of pap smear. Pap with reflex HPV collected - GC/CT: Discussed and recommended. Pt  declines - Birth Control: IUD - Breast Health: Encouraged self breast awareness/exams.  - Follow-up: 12 months and prn  - All questions answered  No orders of the defined types were placed in this encounter.   Meds: No orders of the defined types were placed in this encounter.   Follow-up: No follow-ups on file.  Carter Quarry, MD 08/18/2024 9:59 AM

## 2024-08-22 ENCOUNTER — Encounter: Payer: Self-pay | Admitting: Family Medicine

## 2024-08-22 ENCOUNTER — Ambulatory Visit (INDEPENDENT_AMBULATORY_CARE_PROVIDER_SITE_OTHER): Payer: No Typology Code available for payment source | Admitting: Family Medicine

## 2024-08-22 VITALS — BP 118/64 | HR 64 | Temp 98.0°F | Resp 16 | Ht 66.0 in | Wt 138.6 lb

## 2024-08-22 DIAGNOSIS — Z79899 Other long term (current) drug therapy: Secondary | ICD-10-CM

## 2024-08-22 DIAGNOSIS — Z0000 Encounter for general adult medical examination without abnormal findings: Secondary | ICD-10-CM | POA: Diagnosis not present

## 2024-08-22 DIAGNOSIS — Z3141 Encounter for fertility testing: Secondary | ICD-10-CM | POA: Diagnosis not present

## 2024-08-22 DIAGNOSIS — F900 Attention-deficit hyperactivity disorder, predominantly inattentive type: Secondary | ICD-10-CM | POA: Diagnosis not present

## 2024-08-22 MED ORDER — FLUTICASONE PROPIONATE 50 MCG/ACT NA SUSP: 2.0000 | Freq: Every day | NASAL | 2 refills | Status: AC

## 2024-08-22 MED ORDER — AMPHETAMINE-DEXTROAMPHETAMINE 10 MG PO TABS: 10.0000 mg | ORAL_TABLET | Freq: Two times a day (BID) | ORAL | 0 refills | Status: DC

## 2024-08-22 NOTE — Patient Instructions (Addendum)
Give us 2-3 business days to get the results of your labs back.   Keep the diet clean and stay active.  Please get me a copy of your advanced directive form at your convenience.   Claritin (loratadine), Allegra (fexofenadine), Zyrtec (cetirizine) which is also equivalent to Xyzal (levocetirizine); these are listed in order from weakest to strongest. Generic, and therefore cheaper, options are in the parentheses.   Flonase (fluticasone); nasal spray that is over the counter. 2 sprays each nostril, once daily. Aim towards the same side eye when you spray.  There are available OTC, and the generic versions, which may be cheaper, are in parentheses. Show this to a pharmacist if you have trouble finding any of these items.  Let us know if you need anything.  

## 2024-08-22 NOTE — Progress Notes (Signed)
 Chief Complaint  Patient presents with   Annual Exam    CPE     Well Woman Joan Estrada is here for a complete physical.   Her last physical was >1 year ago.  Current diet: in general, a healthy diet. Current exercise: none lately. Contraception? Yes Patient's last menstrual period was 08/15/2024 (exact date). Fatigue out of ordinary? No Seatbelt? Yes Advanced directive? No  Health Maintenance Pap/HPV- Yes Tetanus- Yes HIV screening- Yes Hep C screening- Yes  Past Medical History:  Diagnosis Date   No known health problems      Past Surgical History:  Procedure Laterality Date   GANGLION CYST EXCISION Left 2010   L wrist   WISDOM TOOTH EXTRACTION  2016    Medications  Current Outpatient Medications on File Prior to Visit  Medication Sig Dispense Refill   amphetamine -dextroamphetamine  (ADDERALL) 10 MG tablet Take 1 tablet (10 mg total) by mouth 2 (two) times daily. 60 tablet 0   buPROPion  (WELLBUTRIN  XL) 150 MG 24 hr tablet Take 1 tablet (150 mg total) by mouth daily. 90 tablet 1   PARAGARD INTRAUTERINE COPPER IU by Intrauterine route.      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 cultures   Trimethoprim     Hx of resistance on Urine cultures    Review of Systems: Constitutional:  no unexpected weight changes Eye:  no recent significant change in vision Ear/Nose/Mouth/Throat:  Ears:  no tinnitus or vertigo and no recent change in hearing on R, decreased hearing on L Nose/Mouth/Throat:  no complaints of nasal congestion, no sore throat Cardiovascular: no chest pain Respiratory:  no cough and no shortness of breath Gastrointestinal:  no abdominal pain, no change in bowel habits GU:  Female: negative for dysuria or pelvic pain Musculoskeletal/Extremities:  no pain of the joints Integumentary (Skin/Breast):  no abnormal skin lesions reported Neurologic:  no headaches Endocrine:  denies  fatigue Hematologic/Lymphatic:  No areas of easy bleeding  Exam BP 118/64 (BP Location: Left Arm, Patient Position: Sitting)   Pulse 64   Temp 98 F (36.7 C) (Oral)   Resp 16   Ht 5' 6 (1.676 m)   Wt 138 lb 9.6 oz (62.9 kg)   LMP 08/15/2024 (Exact Date)   SpO2 98%   BMI 22.37 kg/m  General:  well developed, well nourished, in no apparent distress Skin:  no significant moles, warts, or growths Head:  no masses, lesions, or tenderness Eyes:  pupils equal and round, sclera anicteric without injection Ears:  canals without lesions, TMs shiny without retraction, no obvious effusion, no erythema Nose:  nares patent, mucosa normal, and no drainage  Throat/Pharynx:  lips and gingiva without lesion; tongue and uvula midline; non-inflamed pharynx; no exudates or postnasal drainage Neck: neck supple without adenopathy, thyromegaly, or masses Lungs:  clear to auscultation, breath sounds equal bilaterally, no respiratory distress Cardio:  regular rate and rhythm, no bruits, no LE edema Abdomen:  abdomen soft, nontender; bowel sounds normal; no masses or organomegaly Genital: Defer to GYN Musculoskeletal:  symmetrical muscle groups noted without atrophy or deformity Extremities:  no clubbing, cyanosis, or edema, no deformities, no skin discoloration Neuro:  gait normal; deep tendon reflexes normal and symmetric Psych: well oriented with normal range of affect and appropriate judgment/insight  Assessment and Plan  Well adult exam  High risk medication use - Plan: Drug Monitoring Panel [phone] , Urine  Attention deficit hyperactivity disorder (ADHD),  predominantly inattentive type - Plan: amphetamine -dextroamphetamine  (ADDERALL) 10 MG tablet, amphetamine -dextroamphetamine  (ADDERALL) 10 MG tablet, amphetamine -dextroamphetamine  (ADDERALL) 10 MG tablet  Fertility testing - Plan: FSH, TSH, LH, IBC + Ferritin   Well 28 y.o. female. Counseled on diet and exercise. UDS and CSC updated today.   Advanced directive form provided today.  We discussed the above labs as she wishes for them regarding knowing about her own fertility. She is engaged and not currently trying (Paragard). Immediate family members had no issues w fertility. She is aware of additional cost potential.  Trial INCS for hearing issue. No wax or fluid.  Other orders as above. Follow up in 6 mo. The patient voiced understanding and agreement to the plan.  Mabel Mt [place], DO 08/22/24 2:57 PM

## 2024-08-23 ENCOUNTER — Ambulatory Visit: Payer: Self-pay | Admitting: Family Medicine

## 2024-08-23 LAB — IBC + FERRITIN
Ferritin: 35 ng/mL (ref 10.0–291.0)
Iron: 89 ug/dL (ref 42–145)
Saturation Ratios: 24.2 % (ref 20.0–50.0)
TIBC: 368.2 ug/dL (ref 250.0–450.0)
Transferrin: 263 mg/dL (ref 212.0–360.0)

## 2024-08-23 LAB — CYTOLOGY - PAP
Comment: NEGATIVE
Diagnosis: NEGATIVE
Diagnosis: REACTIVE
High risk HPV: NEGATIVE

## 2024-08-23 LAB — LIPID PANEL
Cholesterol: 203 mg/dL — ABNORMAL HIGH (ref 0–200)
HDL: 86.6 mg/dL (ref >39.00)
LDL Cholesterol: 103 mg/dL — ABNORMAL HIGH (ref 0–99)
NonHDL: 115.98
Total CHOL/HDL Ratio: 2
Triglycerides: 67 mg/dL (ref 0.0–149.0)
VLDL: 13.4 mg/dL (ref 0.0–40.0)

## 2024-08-23 LAB — CBC
HCT: 36.7 % (ref 36.0–46.0)
Hemoglobin: 12.5 g/dL (ref 12.0–15.0)
MCHC: 34 g/dL (ref 30.0–36.0)
MCV: 92.5 fl (ref 78.0–100.0)
Platelets: 260 K/uL (ref 150.0–400.0)
RBC: 3.97 Mil/uL (ref 3.87–5.11)
RDW: 12.7 % (ref 11.5–15.5)
WBC: 5.7 K/uL (ref 4.0–10.5)

## 2024-08-23 LAB — COMPREHENSIVE METABOLIC PANEL WITH GFR
ALT: 11 U/L (ref 0–35)
AST: 18 U/L (ref 0–37)
Albumin: 4.7 g/dL (ref 3.5–5.2)
Alkaline Phosphatase: 34 U/L — ABNORMAL LOW (ref 39–117)
BUN: 14 mg/dL (ref 6–23)
CO2: 28 meq/L (ref 19–32)
Calcium: 9.5 mg/dL (ref 8.4–10.5)
Chloride: 101 meq/L (ref 96–112)
Creatinine, Ser: 0.98 mg/dL (ref 0.40–1.20)
GFR: 79.19 mL/min (ref >60.00)
Glucose, Bld: 77 mg/dL (ref 70–99)
Potassium: 3.8 meq/L (ref 3.5–5.1)
Sodium: 141 meq/L (ref 135–145)
Total Bilirubin: 0.6 mg/dL (ref 0.2–1.2)
Total Protein: 7.1 g/dL (ref 6.0–8.3)

## 2024-08-23 LAB — LUTEINIZING HORMONE: LH: 9.79 m[IU]/mL

## 2024-08-23 LAB — TSH: TSH: 1.51 u[IU]/mL (ref 0.35–5.50)

## 2024-08-23 LAB — FOLLICLE STIMULATING HORMONE: FSH: 6.8 m[IU]/mL

## 2024-08-25 LAB — DRUG MONITORING PANEL 376104, URINE
Amphetamines: NEGATIVE ng/mL (ref <500)
Barbiturates: NEGATIVE ng/mL (ref <300)
Benzodiazepines: NEGATIVE ng/mL (ref <100)
Cocaine Metabolite: NEGATIVE ng/mL (ref <150)
Desmethyltramadol: NEGATIVE ng/mL (ref <100)
Opiates: NEGATIVE ng/mL (ref <100)
Oxycodone: NEGATIVE ng/mL (ref <100)
Tramadol: NEGATIVE ng/mL (ref <100)

## 2024-08-25 LAB — DM TEMPLATE

## 2024-08-28 ENCOUNTER — Ambulatory Visit: Payer: Self-pay | Admitting: Obstetrics and Gynecology

## 2024-09-30 ENCOUNTER — Other Ambulatory Visit: Payer: Self-pay | Admitting: Family Medicine

## 2024-09-30 DIAGNOSIS — F411 Generalized anxiety disorder: Secondary | ICD-10-CM

## 2024-09-30 DIAGNOSIS — F338 Other recurrent depressive disorders: Secondary | ICD-10-CM

## 2024-10-03 ENCOUNTER — Telehealth: Admitting: Family Medicine

## 2024-10-03 DIAGNOSIS — R3989 Other symptoms and signs involving the genitourinary system: Secondary | ICD-10-CM

## 2024-10-03 MED ORDER — CEPHALEXIN 500 MG PO CAPS: 500.0000 mg | ORAL_CAPSULE | Freq: Two times a day (BID) | ORAL | 0 refills | Status: AC

## 2024-10-03 NOTE — Progress Notes (Signed)

## 2024-10-04 ENCOUNTER — Ambulatory Visit: Admitting: Family Medicine

## 2024-11-01 ENCOUNTER — Other Ambulatory Visit: Payer: Self-pay | Admitting: Family Medicine

## 2024-11-01 ENCOUNTER — Encounter: Payer: Self-pay | Admitting: Family Medicine

## 2024-11-01 DIAGNOSIS — F338 Other recurrent depressive disorders: Secondary | ICD-10-CM

## 2024-11-01 DIAGNOSIS — F411 Generalized anxiety disorder: Secondary | ICD-10-CM

## 2024-11-01 MED ORDER — BUPROPION HCL ER (XL) 150 MG PO TB24: 150.0000 mg | ORAL_TABLET | Freq: Every day | ORAL | 1 refills | Status: AC

## 2024-11-01 NOTE — Addendum Note (Signed)
 Addended by: Junetta Hearn D on: 11/01/2024 03:26 PM   Modules accepted: Orders

## 2025-01-01 ENCOUNTER — Encounter: Payer: Self-pay | Admitting: Family Medicine

## 2025-01-01 ENCOUNTER — Other Ambulatory Visit: Payer: Self-pay | Admitting: Family Medicine

## 2025-01-01 DIAGNOSIS — F900 Attention-deficit hyperactivity disorder, predominantly inattentive type: Secondary | ICD-10-CM

## 2025-01-01 MED ORDER — AMPHETAMINE-DEXTROAMPHETAMINE 10 MG PO TABS: 10.0000 mg | ORAL_TABLET | Freq: Two times a day (BID) | ORAL | 0 refills | Status: AC

## 2025-02-19 ENCOUNTER — Ambulatory Visit: Admitting: Family Medicine
